# Patient Record
Sex: Female | Born: 1955 | Race: White | Hispanic: No | Marital: Single | Smoking: Former smoker
Health system: Southern US, Community
[De-identification: ages and names within clinical notes are randomized; demographics above are authoritative.]

## PROBLEM LIST (undated history)

## (undated) ENCOUNTER — Ambulatory Visit (HOSPITAL_COMMUNITY): Admission: EM | Payer: BC Managed Care – PPO | Source: Home / Self Care

## (undated) DIAGNOSIS — R011 Cardiac murmur, unspecified: Secondary | ICD-10-CM

## (undated) DIAGNOSIS — R42 Dizziness and giddiness: Secondary | ICD-10-CM

## (undated) DIAGNOSIS — E039 Hypothyroidism, unspecified: Secondary | ICD-10-CM

## (undated) HISTORY — PX: BLEPHAROPLASTY: SUR158

## (undated) HISTORY — PX: RHINOPLASTY: SUR1284

## (undated) HISTORY — PX: CERVICAL DISCECTOMY: SHX98

## (undated) HISTORY — PX: DIAGNOSTIC LAPAROSCOPY: SUR761

---

## 2005-11-19 ENCOUNTER — Ambulatory Visit: Payer: Self-pay | Admitting: Otolaryngology

## 2005-12-09 ENCOUNTER — Encounter: Payer: Self-pay | Admitting: Otolaryngology

## 2005-12-23 ENCOUNTER — Encounter: Payer: Self-pay | Admitting: Otolaryngology

## 2006-01-23 ENCOUNTER — Encounter: Payer: Self-pay | Admitting: Otolaryngology

## 2007-01-30 ENCOUNTER — Emergency Department: Payer: Self-pay | Admitting: Emergency Medicine

## 2007-01-30 ENCOUNTER — Other Ambulatory Visit: Payer: Self-pay

## 2007-08-11 ENCOUNTER — Ambulatory Visit: Payer: Self-pay | Admitting: Emergency Medicine

## 2013-01-29 ENCOUNTER — Emergency Department: Payer: Self-pay | Admitting: Emergency Medicine

## 2013-02-01 ENCOUNTER — Encounter: Payer: Self-pay | Admitting: Emergency Medicine

## 2013-02-20 ENCOUNTER — Encounter: Payer: Self-pay | Admitting: Emergency Medicine

## 2017-07-11 ENCOUNTER — Telehealth: Payer: Self-pay | Admitting: Gastroenterology

## 2017-07-11 NOTE — Telephone Encounter (Signed)
Patient ready to schedule screening colonoscopy, in [place] with Dr. Allen Norris. 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

## 2017-07-14 ENCOUNTER — Other Ambulatory Visit: Payer: Self-pay

## 2017-07-14 DIAGNOSIS — Z1211 Encounter for screening for malignant neoplasm of colon: Secondary | ICD-10-CM

## 2017-07-14 DIAGNOSIS — Z8371 Family history of colonic polyps: Secondary | ICD-10-CM

## 2017-07-14 NOTE — Telephone Encounter (Signed)
Gastroenterology Pre-Procedure Review  Request Date: 08/04/17 Requesting Physician: Dr. Allen Norris  PATIENT REVIEW QUESTIONS: The patient responded to the following health history questions as indicated:    1. Are you having any GI issues? no 2. Do you have a personal history of Polyps? no 3. Do you have a family history of Colon Cancer or Polyps? yes (Dad Polyps) 4. Diabetes Mellitus? no 5. Joint replacements in the past 12 months?no 6. Major health problems in the past 3 months?no 7. Any artificial heart valves, MVP, or defibrillator?no    MEDICATIONS & ALLERGIES:    Patient reports the following regarding taking any anticoagulation/antiplatelet therapy:   Plavix, Coumadin, Eliquis, Xarelto, Lovenox, Pradaxa, Brilinta, or Effient? no Aspirin? no  Patient confirms/reports the following medications:  No current outpatient prescriptions on file.   No current facility-administered medications for this visit.     Patient confirms/reports the following allergies:  Allergies not on file  No orders of the defined types were placed in this encounter.   AUTHORIZATION INFORMATION Primary Insurance: 1D#: Group #:  Secondary Insurance: 1D#: Group #:  SCHEDULE INFORMATION: Date: 08/04/17 Time: Location:[facility]

## 2017-07-22 ENCOUNTER — Telehealth: Payer: Self-pay | Admitting: Gastroenterology

## 2017-07-22 NOTE — Telephone Encounter (Signed)
07/22/17 [employer] state for Screening Colonoscopy 01314 / Z12.11 NO prior auth required Allen Norris, [place], 08/04/17)

## 2017-08-01 NOTE — Discharge Instructions (Signed)
General Anesthesia, Adult, Care After °These instructions provide you with information about caring for yourself after your procedure. Your health care provider may also give you more specific instructions. Your treatment has been planned according to current medical practices, but problems sometimes occur. Call your health care provider if you have any problems or questions after your procedure. °What can I expect after the procedure? °After the procedure, it is common to have: °· Vomiting. °· A sore throat. °· Mental slowness. ° °It is common to feel: °· Nauseous. °· Cold or shivery. °· Sleepy. °· Tired. °· Sore or achy, even in parts of your body where you did not have surgery. ° °Follow these instructions at home: °For at least 24 hours after the procedure: °· Do not: °? Participate in activities where you could fall or become injured. °? Drive. °? Use heavy machinery. °? Drink alcohol. °? Take sleeping pills or medicines that cause drowsiness. °? Make important decisions or sign legal documents. °? Take care of children on your own. °· Rest. °Eating and drinking °· If you vomit, drink water, juice, or soup when you can drink without vomiting. °· Drink enough fluid to keep your urine clear or pale yellow. °· Make sure you have little or no nausea before eating solid foods. °· Follow the diet recommended by your health care provider. °General instructions °· Have a responsible adult stay with you until you are awake and alert. °· Return to your normal activities as told by your health care provider. Ask your health care provider what activities are safe for you. °· Take over-the-counter and prescription medicines only as told by your health care provider. °· If you smoke, do not smoke without supervision. °· Keep all follow-up visits as told by your health care provider. This is important. °Contact a health care provider if: °· You continue to have nausea or vomiting at home, and medicines are not helpful. °· You  cannot drink fluids or start eating again. °· You cannot urinate after 8-12 hours. °· You develop a skin rash. °· You have fever. °· You have increasing redness at the site of your procedure. °Get help right away if: °· You have difficulty breathing. °· You have chest pain. °· You have unexpected bleeding. °· You feel that you are having a life-threatening or urgent problem. °This information is not intended to replace advice given to you by your health care provider. Make sure you discuss any questions you have with your health care provider. °Document Released: 03/17/2001 Document Revised: 05/13/2016 Document Reviewed: 11/23/2015 °Elsevier Interactive Patient Education © 2018 [employer] Inc. ° °

## 2017-08-04 ENCOUNTER — Ambulatory Visit: Payer: BC Managed Care – PPO | Admitting: Anesthesiology

## 2017-08-04 ENCOUNTER — Ambulatory Visit
Admission: RE | Admit: 2017-08-04 | Discharge: 2017-08-04 | Disposition: A | Discharge status: Home / Self Care | Payer: BC Managed Care – PPO | Source: Ambulatory Visit | Attending: Gastroenterology | Admitting: Gastroenterology

## 2017-08-04 ENCOUNTER — Encounter
Admission: RE | Disposition: A | Discharge status: Home / Self Care | Payer: Self-pay | Source: Ambulatory Visit | Attending: Gastroenterology

## 2017-08-04 DIAGNOSIS — R011 Cardiac murmur, unspecified: Secondary | ICD-10-CM | POA: Insufficient documentation

## 2017-08-04 DIAGNOSIS — Z79899 Other long term (current) drug therapy: Secondary | ICD-10-CM | POA: Insufficient documentation

## 2017-08-04 DIAGNOSIS — D122 Benign neoplasm of ascending colon: Secondary | ICD-10-CM | POA: Diagnosis not present

## 2017-08-04 DIAGNOSIS — E039 Hypothyroidism, unspecified: Secondary | ICD-10-CM | POA: Insufficient documentation

## 2017-08-04 DIAGNOSIS — F17210 Nicotine dependence, cigarettes, uncomplicated: Secondary | ICD-10-CM | POA: Insufficient documentation

## 2017-08-04 DIAGNOSIS — Z1211 Encounter for screening for malignant neoplasm of colon: Secondary | ICD-10-CM | POA: Diagnosis not present

## 2017-08-04 HISTORY — DX: Cardiac murmur, unspecified: R01.1

## 2017-08-04 HISTORY — DX: Dizziness and giddiness: R42

## 2017-08-04 HISTORY — PX: POLYPECTOMY: SHX5525

## 2017-08-04 HISTORY — DX: Hypothyroidism, unspecified: E03.9

## 2017-08-04 HISTORY — PX: COLONOSCOPY WITH PROPOFOL: SHX5780

## 2017-08-04 SURGERY — COLONOSCOPY WITH PROPOFOL
Anesthesia: General | Site: Rectum | Wound class: Dirty or Infected

## 2017-08-04 MED ORDER — STERILE WATER FOR IRRIGATION IR SOLN
Status: DC | PRN
  Administered 2017-08-04: 10:00:00

## 2017-08-04 MED ORDER — LACTATED RINGERS IV SOLN
INTRAVENOUS | Status: DC
  Administered 2017-08-04: 10:00:00 via INTRAVENOUS

## 2017-08-04 MED ORDER — PROPOFOL 10 MG/ML IV BOLUS
INTRAVENOUS | Status: DC | PRN
  Administered 2017-08-04: 10 mg via INTRAVENOUS
  Administered 2017-08-04 (×3): 20 mg via INTRAVENOUS
  Administered 2017-08-04: 10 mg via INTRAVENOUS
  Administered 2017-08-04: 80 mg via INTRAVENOUS
  Administered 2017-08-04: 20 mg via INTRAVENOUS

## 2017-08-04 MED ORDER — ONDANSETRON HCL 4 MG/2ML IJ SOLN: 4.0000 mg | Freq: Once | INTRAMUSCULAR | Status: DC | PRN

## 2017-08-04 MED ORDER — LIDOCAINE HCL (CARDIAC) 20 MG/ML IV SOLN
INTRAVENOUS | Status: DC | PRN
  Administered 2017-08-04: 50 mg via INTRAVENOUS

## 2017-08-04 SURGICAL SUPPLY — 23 items
CANISTER SUCT 1200ML W/VALVE (MISCELLANEOUS) ×3 IMPLANT
CLIP HMST 235XBRD CATH ROT (MISCELLANEOUS) IMPLANT
CLIP RESOLUTION 360 11X235 (MISCELLANEOUS)
FCP ESCP3.2XJMB 240X2.8X (MISCELLANEOUS)
FORCEPS BIOP RAD 4 LRG CAP 4 (CUTTING FORCEPS) IMPLANT
FORCEPS BIOP RJ4 240 W/NDL (MISCELLANEOUS)
FORCEPS ESCP3.2XJMB 240X2.8X (MISCELLANEOUS) IMPLANT
GOWN CVR UNV OPN BCK APRN NK (MISCELLANEOUS) ×2 IMPLANT
GOWN ISOL THUMB LOOP REG UNIV (MISCELLANEOUS) ×6
INJECTOR VARIJECT VIN23 (MISCELLANEOUS) IMPLANT
KIT DEFENDO VALVE AND CONN (KITS) IMPLANT
KIT ENDO PROCEDURE OLY (KITS) ×3 IMPLANT
MARKER SPOT ENDO TATTOO 5ML (MISCELLANEOUS) IMPLANT
PAD GROUND ADULT SPLIT (MISCELLANEOUS) IMPLANT
PROBE APC STR FIRE (PROBE) IMPLANT
RETRIEVER NET ROTH 2.5X230 LF (MISCELLANEOUS) IMPLANT
SNARE SHORT THROW 13M SML OVAL (MISCELLANEOUS) ×2 IMPLANT
SNARE SHORT THROW 30M LRG OVAL (MISCELLANEOUS) IMPLANT
SNARE SNG USE RND 15MM (INSTRUMENTS) IMPLANT
SPOT EX ENDOSCOPIC TATTOO (MISCELLANEOUS)
TRAP ETRAP POLY (MISCELLANEOUS) ×2 IMPLANT
VARIJECT INJECTOR VIN23 (MISCELLANEOUS)
WATER STERILE IRR 250ML POUR (IV SOLUTION) ×3 IMPLANT

## 2017-08-04 NOTE — Transfer of Care (Signed)
Immediate Anesthesia Transfer of Care Note  Patient: [name]  Procedure(s) Performed: Procedure(s): COLONOSCOPY WITH PROPOFOL (N/A) POLYPECTOMY (N/A)  Patient Location: PACU  Anesthesia Type: General  Level of Consciousness: awake, alert  and patient cooperative  Airway and Oxygen Therapy: Patient Spontanous Breathing and Patient connected to supplemental oxygen  Post-op Assessment: Post-op Vital signs reviewed, Patient's Cardiovascular Status Stable, Respiratory Function Stable, Patent Airway and No signs of Nausea or vomiting  Post-op Vital Signs: Reviewed and stable  Complications: No apparent anesthesia complications

## 2017-08-04 NOTE — Op Note (Signed)
[facility] Gastroenterology Patient Name: Julie Sparks Procedure Date: 08/04/2017 10:14 AM MRN: [id number] Account #: [id number] Date of Birth: Apr 26, 1956 Admit Type: Outpatient Age: 61 Room: [facility] OR ROOM 01 Gender: Female Note Status: Finalized Procedure:            Colonoscopy Indications:          Screening for colorectal malignant neoplasm Providers:            Lucilla Lame MD, MD Referring MD:         Marney Doctor, MD (Referring MD) Medicines:            Propofol per Anesthesia Complications:        No immediate complications. Procedure:            Pre-Anesthesia Assessment:                       - Prior to the procedure, a History and Physical was                        performed, and patient medications and allergies were                        reviewed. The patient's tolerance of previous                        anesthesia was also reviewed. The risks and benefits of                        the procedure and the sedation options and risks were                        discussed with the patient. All questions were                        answered, and informed consent was obtained. Prior                        Anticoagulants: The patient has taken no previous                        anticoagulant or antiplatelet agents. ASA Grade                        Assessment: II - A patient with mild systemic disease.                        After reviewing the risks and benefits, the patient was                        deemed in satisfactory condition to undergo the                        procedure.                       After obtaining informed consent, the colonoscope was                        passed under direct vision. Throughout the procedure,  the patient's blood pressure, pulse, and oxygen                        saturations were monitored continuously. The Olympus                        Colonoscope 190 [id number]) was introduced through the                         anus and advanced to the the cecum, identified by                        appendiceal orifice and ileocecal valve. The                        colonoscopy was performed without difficulty. The                        patient tolerated the procedure well. The quality of                        the bowel preparation was excellent. Findings:      The perianal and digital rectal examinations were normal.      A 5 mm polyp was found in the ascending colon. The polyp was sessile.       The polyp was removed with a cold snare. Resection and retrieval were       complete. Impression:           - One 5 mm polyp in the ascending colon, removed with a                        cold snare. Resected and retrieved. Recommendation:       - Discharge patient to home.                       - Resume previous diet.                       - Continue present medications.                       - Repeat colonoscopy in 5 years if polyp adenoma and 10                        years if hyperplastic Procedure Code(s):    --- Professional ---                       [phone], Colonoscopy, flexible; with removal of tumor(s),                        polyp(s), or other lesion(s) by snare technique Diagnosis Code(s):    --- Professional ---                       Z12.11, Encounter for screening for malignant neoplasm                        of colon                       D12.2,  Benign neoplasm of ascending colon CPT copyright 2016 American Medical Association. All rights reserved. The codes documented in this report are preliminary and upon coder review may  be revised to meet current compliance requirements. Lucilla Lame MD, MD 08/04/2017 10:34:30 AM This report has been signed electronically. Number of Addenda: 0 Note Initiated On: 08/04/2017 10:14 AM Scope Withdrawal Time: 0 hours 6 minutes 0 seconds  Total Procedure Duration: 0 hours 10 minutes 52 seconds       [facility]

## 2017-08-04 NOTE — Anesthesia Procedure Notes (Signed)
Performed by: Luiz Trumpower Pre-anesthesia Checklist: Patient identified, Emergency Drugs available, Suction available, Timeout performed and Patient being monitored Patient Re-evaluated:Patient Re-evaluated prior to induction Oxygen Delivery Method: Nasal cannula Placement Confirmation: positive ETCO2       

## 2017-08-04 NOTE — Anesthesia Postprocedure Evaluation (Signed)
Anesthesia Post Note  Patient: Julie Sparks  Procedure(s) Performed: Procedure(s) (LRB): COLONOSCOPY WITH PROPOFOL (N/A) POLYPECTOMY (N/A)  Patient location during evaluation: PACU Anesthesia Type: General Level of consciousness: awake and alert, oriented and patient cooperative Pain management: pain level controlled Vital Signs Assessment: post-procedure vital signs reviewed and stable Respiratory status: spontaneous breathing, nonlabored ventilation and respiratory function stable Cardiovascular status: blood pressure returned to baseline and stable Postop Assessment: adequate PO intake Anesthetic complications: no    Darrin Nipper

## 2017-08-04 NOTE — H&P (Signed)
   Lucilla Lame, MD [address], [place] [address] Phone: [phone] Fax : [phone]  Primary Care Physician:  Rhoderick Moody, MD Primary Gastroenterologist:  Dr. Allen Norris  Pre-Procedure History & Physical: HPI:  Julie Sparks is a 61 y.o. female is here for a screening colonoscopy.   Past Medical History:  Diagnosis Date  . Heart murmur    Functional  . Hypothyroidism   . Vertigo    positional    Past Surgical History:  Procedure Laterality Date  . BLEPHAROPLASTY Bilateral   . CERVICAL DISCECTOMY    . DIAGNOSTIC LAPAROSCOPY    . RHINOPLASTY      Prior to Admission medications   Medication Sig Start Date End Date Taking? Authorizing Provider  Estradiol-Norethindrone Acet 0.5-0.1 MG tablet Take 1 tablet by mouth daily.   Yes [provider]  ibuprofen (ADVIL,MOTRIN) 200 MG tablet Take 200 mg by mouth every 6 (six) hours as needed.   Yes [provider]  levothyroxine (SYNTHROID, LEVOTHROID) 25 MCG tablet Take 25 mcg by mouth daily before breakfast.   Yes [provider]    Allergies as of 07/14/2017  . (Not on File)    History reviewed. No pertinent family history.  Social History   Social History  . Marital status: Single    Spouse name: N/A  . Number of children: N/A  . Years of education: N/A   Occupational History  . Not on file.   Social History Main Topics  . Smoking status: Light Tobacco Smoker  . Smokeless tobacco: Never Used     Comment: only smokes when stressed  . Alcohol use Not on file     Comment: occasional  . Drug use: No  . Sexual activity: Not on file   Other Topics Concern  . Not on file   Social History Narrative  . No narrative on file    Review of Systems: See HPI, otherwise negative ROS  Physical Exam: BP 112/66   Pulse 66   Temp 98.2 F (36.8 C) (Tympanic)   Resp 16   Ht 5' 1.5" (1.562 m)   Wt 155 lb (70.3 kg)   SpO2 98%   BMI 28.81 kg/m  General:   Alert,   pleasant and cooperative in NAD Head:  Normocephalic and atraumatic. Neck:  Supple; no masses or thyromegaly. Lungs:  Clear throughout to auscultation.    Heart:  Regular rate and rhythm. Abdomen:  Soft, nontender and nondistended. Normal bowel sounds, without guarding, and without rebound.   Neurologic:  Alert and  oriented x4;  grossly normal neurologically.  Impression/Plan: Julie Sparks is now here to undergo a screening colonoscopy.  Risks, benefits, and alternatives regarding colonoscopy have been reviewed with the patient.  Questions have been answered.  All parties agreeable.

## 2017-08-04 NOTE — Anesthesia Preprocedure Evaluation (Signed)
Anesthesia Evaluation  Patient identified by MRN, date of birth, ID band Patient awake    Reviewed: Allergy & Precautions, NPO status , Patient's Chart, lab work & pertinent test results  History of Anesthesia Complications Negative for: history of anesthetic complications  Airway Mallampati: I  TM Distance: >3 FB Neck ROM: Full    Dental no notable dental hx.    Pulmonary Current Smoker (2 cigarettes per day),  Snoring    Pulmonary exam normal breath sounds clear to auscultation       Cardiovascular Exercise Tolerance: Good + Valvular Problems/Murmurs (heart murmur)  Rhythm:Regular Rate:Normal     Neuro/Psych Vertigo     GI/Hepatic negative GI ROS,   Endo/Other  Hypothyroidism   Renal/GU negative Renal ROS     Musculoskeletal   Abdominal   Peds  Hematology negative hematology ROS (+)   Anesthesia Other Findings   Reproductive/Obstetrics                             Anesthesia Physical Anesthesia Plan  ASA: II  Anesthesia Plan: General   Post-op Pain Management:    Induction: Intravenous  PONV Risk Score and Plan: 1 and Ondansetron and Propofol infusion  Airway Management Planned: Natural Airway  Additional Equipment:   Intra-op Plan:   Post-operative Plan:   Informed Consent: I have reviewed the patients History and Physical, chart, labs and discussed the procedure including the risks, benefits and alternatives for the proposed anesthesia with the patient or authorized representative who has indicated his/her understanding and acceptance.     Plan Discussed with: CRNA  Anesthesia Plan Comments:         Anesthesia Quick Evaluation

## 2017-08-05 ENCOUNTER — Encounter: Payer: Self-pay | Admitting: Gastroenterology

## 2017-08-06 ENCOUNTER — Encounter: Payer: Self-pay | Admitting: Gastroenterology

## 2017-08-07 ENCOUNTER — Encounter: Payer: Self-pay | Admitting: Gastroenterology

## 2018-04-10 ENCOUNTER — Other Ambulatory Visit: Payer: Self-pay | Admitting: Obstetrics and Gynecology

## 2018-04-10 DIAGNOSIS — Z1231 Encounter for screening mammogram for malignant neoplasm of breast: Secondary | ICD-10-CM

## 2018-04-28 ENCOUNTER — Encounter (INDEPENDENT_AMBULATORY_CARE_PROVIDER_SITE_OTHER): Payer: Self-pay

## 2018-04-28 ENCOUNTER — Ambulatory Visit
Admission: RE | Admit: 2018-04-28 | Discharge: 2018-04-28 | Disposition: A | Discharge status: Home / Self Care | Payer: BC Managed Care – PPO | Source: Ambulatory Visit | Attending: Obstetrics and Gynecology | Admitting: Obstetrics and Gynecology

## 2018-04-28 DIAGNOSIS — Z1231 Encounter for screening mammogram for malignant neoplasm of breast: Secondary | ICD-10-CM | POA: Diagnosis not present

## 2018-05-01 ENCOUNTER — Other Ambulatory Visit: Payer: Self-pay | Admitting: *Deleted

## 2018-05-01 ENCOUNTER — Inpatient Hospital Stay
Admission: RE | Admit: 2018-05-01 | Discharge: 2018-05-01 | Disposition: A | Discharge status: Home / Self Care | Payer: Self-pay | Source: Ambulatory Visit | Attending: *Deleted | Admitting: *Deleted

## 2018-05-01 DIAGNOSIS — Z9289 Personal history of other medical treatment: Secondary | ICD-10-CM

## 2019-04-26 IMAGING — MG MM DIGITAL SCREENING BILAT W/ TOMO W/ CAD
8 series · 8 of 24 positions shown · non-contrast
Comparison: Previous exam(s).

CLINICAL DATA: Screening.

EXAM:
DIGITAL SCREENING BILATERAL MAMMOGRAM WITH TOMO AND CAD

[R CC synth-2D]
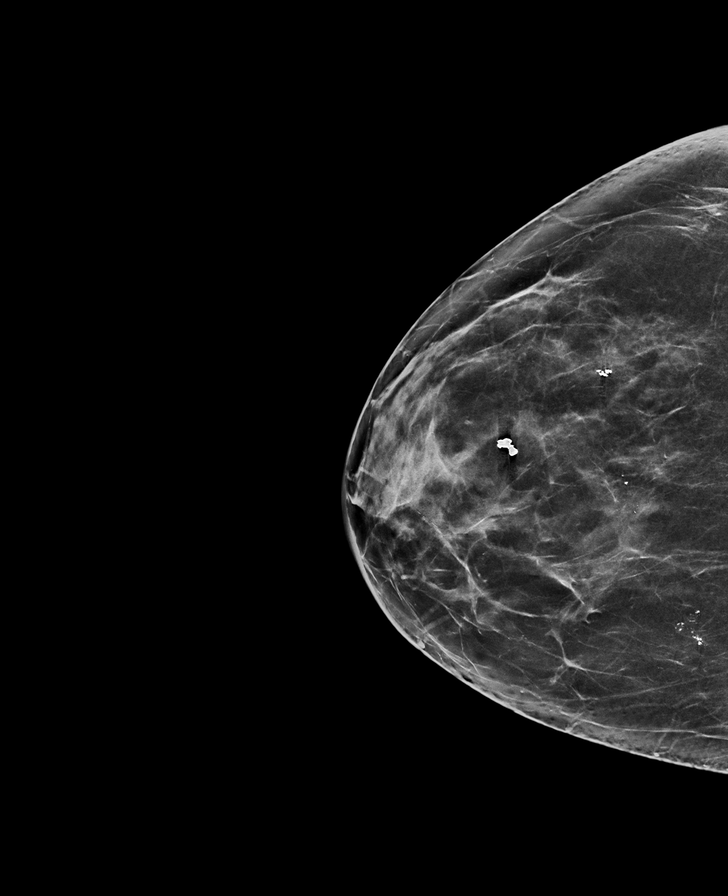

[L MLO synth-2D]
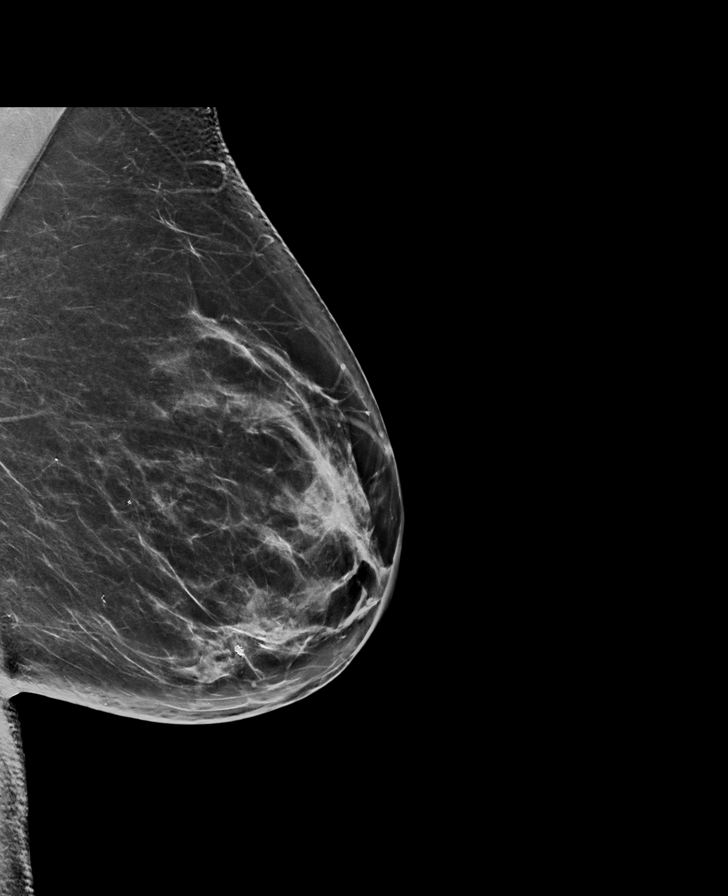

[R MLO synth-2D]
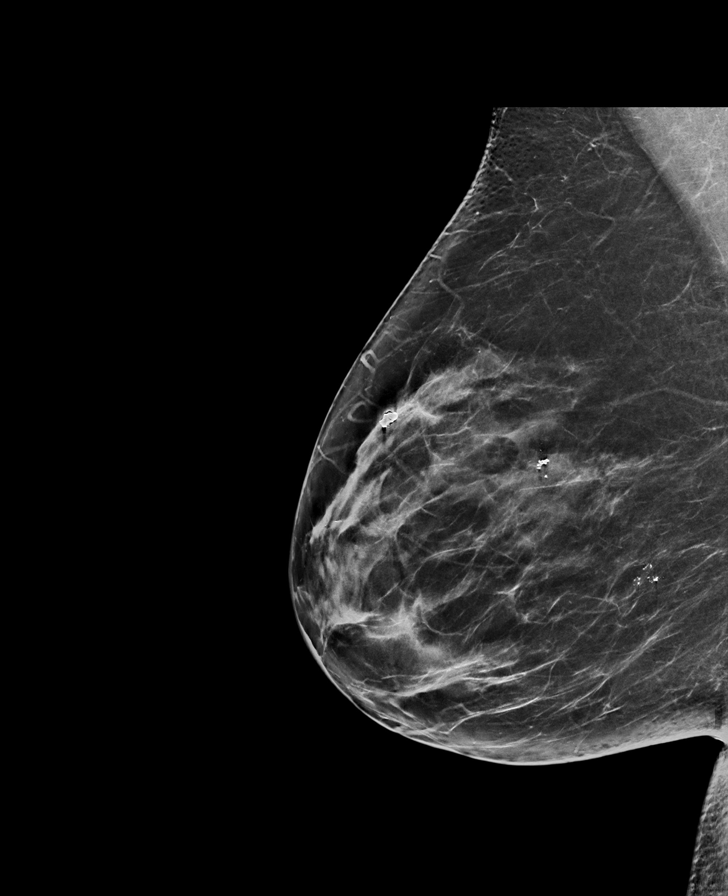

[L CC synth-2D]
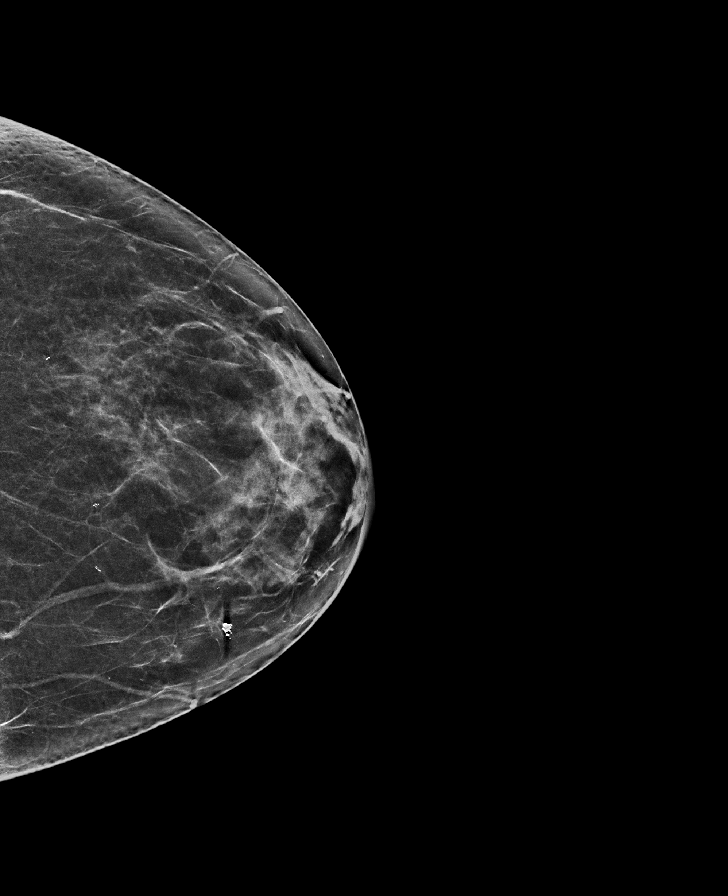

[R CC tomo · tomo slice 37/72.0]
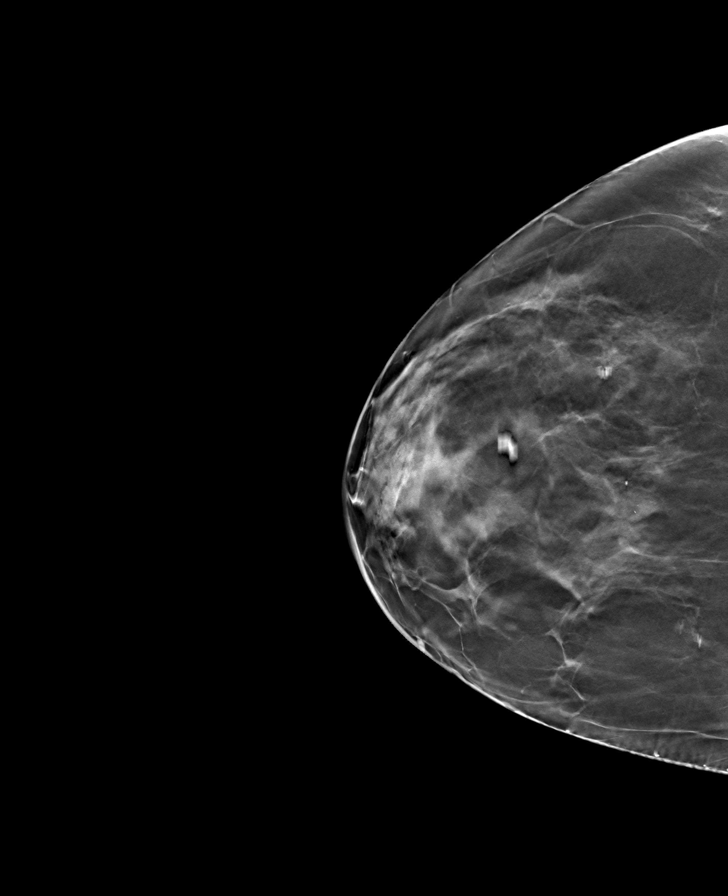

[L MLO tomo · tomo slice 39/78.0]
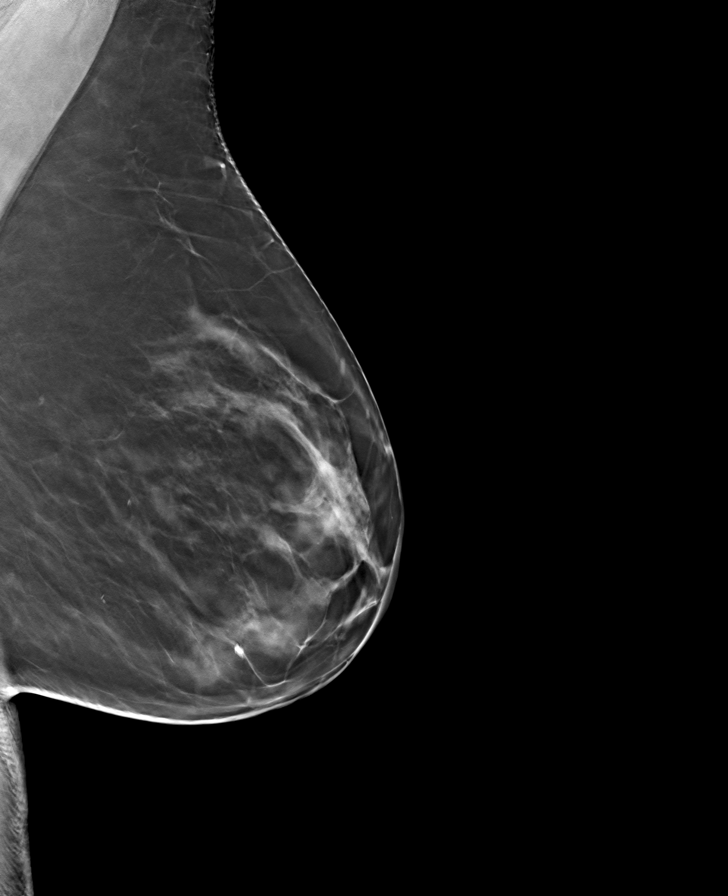

[R MLO tomo · tomo slice 39/77.0]
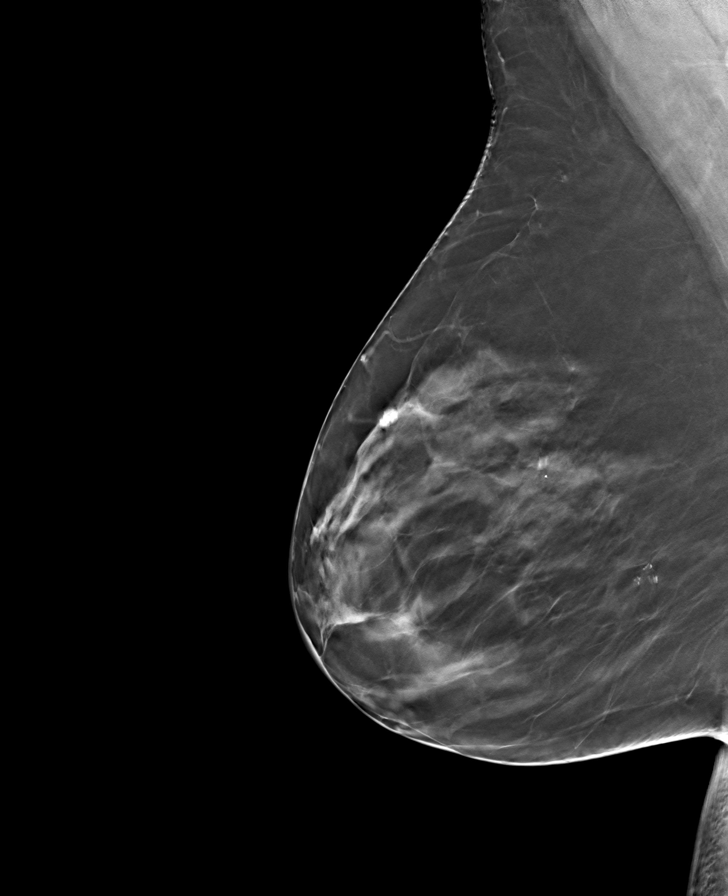

[L CC tomo · tomo slice 35/69.0]
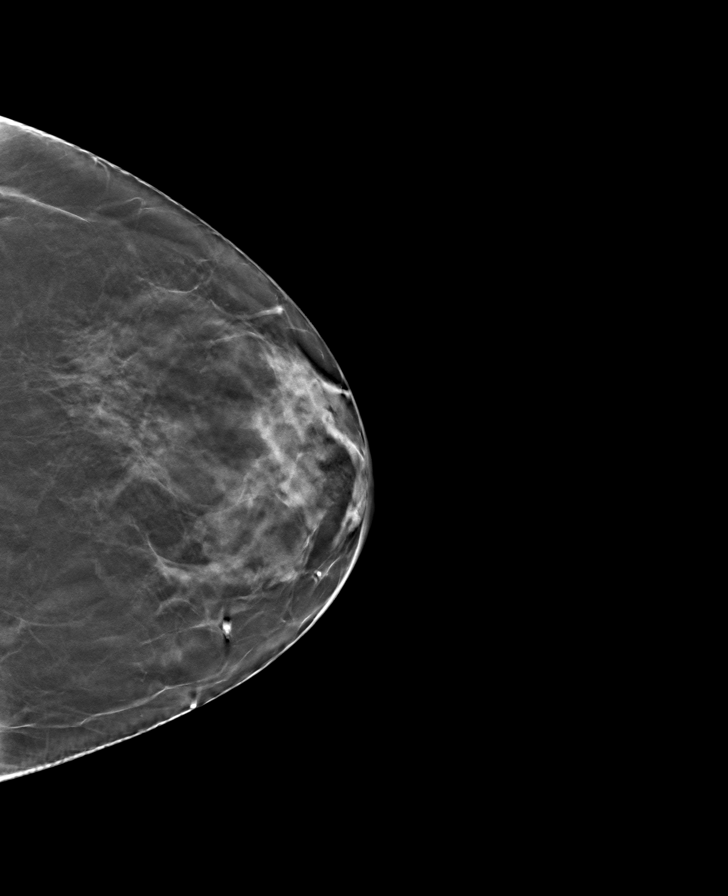

[8 of 24 positions shown; findings below may reference images not displayed]

ACR Breast Density Category b: There are scattered areas of
fibroglandular density.
FINDINGS: There are no findings suspicious for malignancy. Images were
processed with CAD.
IMPRESSION: No mammographic evidence of malignancy. A result letter of this
screening mammogram will be mailed directly to the patient.

RECOMMENDATION:
Screening mammogram in one year. (Code:[id number]-775)

BI-RADS CATEGORY  1: Negative.

## 2019-06-29 ENCOUNTER — Other Ambulatory Visit: Payer: Self-pay

## 2019-06-29 ENCOUNTER — Other Ambulatory Visit: Payer: Self-pay | Admitting: Obstetrics and Gynecology

## 2019-06-29 DIAGNOSIS — Z1231 Encounter for screening mammogram for malignant neoplasm of breast: Secondary | ICD-10-CM

## 2019-07-14 ENCOUNTER — Other Ambulatory Visit: Payer: Self-pay

## 2019-07-14 ENCOUNTER — Encounter (INDEPENDENT_AMBULATORY_CARE_PROVIDER_SITE_OTHER): Payer: Self-pay

## 2019-07-14 ENCOUNTER — Ambulatory Visit
Admission: RE | Admit: 2019-07-14 | Discharge: 2019-07-14 | Disposition: A | Discharge status: Home / Self Care | Payer: BC Managed Care – PPO | Source: Ambulatory Visit | Attending: Obstetrics and Gynecology | Admitting: Obstetrics and Gynecology

## 2019-07-14 DIAGNOSIS — Z1231 Encounter for screening mammogram for malignant neoplasm of breast: Secondary | ICD-10-CM | POA: Insufficient documentation

## 2019-10-08 ENCOUNTER — Encounter: Payer: Self-pay | Admitting: Emergency Medicine

## 2019-10-08 ENCOUNTER — Ambulatory Visit
Admission: EM | Admit: 2019-10-08 | Discharge: 2019-10-08 | Disposition: A | Discharge status: Home / Self Care | Payer: BC Managed Care – PPO | Attending: Emergency Medicine | Admitting: Emergency Medicine

## 2019-10-08 ENCOUNTER — Other Ambulatory Visit: Payer: Self-pay

## 2019-10-08 DIAGNOSIS — H6122 Impacted cerumen, left ear: Secondary | ICD-10-CM

## 2019-10-08 DIAGNOSIS — H938X2 Other specified disorders of left ear: Secondary | ICD-10-CM

## 2019-10-08 NOTE — Discharge Instructions (Addendum)
I would suggest wearing earmuffs rather than earplugs when you do your yard work.  You can use Debrox to help prevent wax buildup in the future.  Try using Flonase on a daily basis.  Try an antihistamine such as Claritin, Allegra, or Zyrtec to see if this helps.  If this is not help, then follow-up with ENT.  There is no evidence of infection or perforation today.

## 2019-10-08 NOTE — ED Triage Notes (Signed)
Patient c/o fullness and pain in her left ear that started 2 days ago.

## 2019-10-08 NOTE — ED Provider Notes (Signed)
HPI  SUBJECTIVE:  Julie Sparks is a 63 y.o. female who presents with left ear fullness for the past several weeks.  She states that it feels "uncomfortable", but denies pain.  Reports decreased hearing during this time, reports complete loss of hearing since last night after putting peroxide in her ear.  She denies fevers, nasal congestion, URI, allergy symptoms.  No otorrhea.  No vertigo.  She reports tinnitus, but states that this is always present.  It has not changed.  She wears earplugs when she does yard work which is more than once a week.  She tried putting peroxide in her ear, and states that "it would not come out".  She tried suction, jumping, hairdryer, tugging on her ear, yawning.  No aggravating or alleviating factors.  Past medical history of bilateral vertigo.  PMD: Dr. Jaquita Rector  ENT: None.    Past Medical History:  Diagnosis Date  . Heart murmur    Functional  . Hypothyroidism   . Vertigo    positional    Past Surgical History:  Procedure Laterality Date  . BLEPHAROPLASTY Bilateral   . CERVICAL DISCECTOMY    . COLONOSCOPY WITH PROPOFOL N/A 08/04/2017   Procedure: COLONOSCOPY WITH PROPOFOL;  Surgeon: Lucilla Lame, MD;  Location: [place];  Service: Gastroenterology;  Laterality: N/A;  . DIAGNOSTIC LAPAROSCOPY    . POLYPECTOMY N/A 08/04/2017   Procedure: POLYPECTOMY;  Surgeon: Lucilla Lame, MD;  Location: [place];  Service: Gastroenterology;  Laterality: N/A;  . RHINOPLASTY      Family History  Problem Relation Age of Onset  . Breast cancer Neg Hx     Social History   Tobacco Use  . Smoking status: Former Research scientist (life sciences)  . Smokeless tobacco: Never Used  . Tobacco comment: only smokes when stressed  Substance Use Topics  . Alcohol use: Yes    Comment: occasional  . Drug use: No    No current facility-administered medications for this encounter.   Current Outpatient Medications:  .  levothyroxine (SYNTHROID, LEVOTHROID) 25 MCG tablet,  Take 25 mcg by mouth daily before breakfast., Disp: , Rfl:  .  Estradiol-Norethindrone Acet 0.5-0.1 MG tablet, Take 1 tablet by mouth daily., Disp: , Rfl:  .  ibuprofen (ADVIL,MOTRIN) 200 MG tablet, Take 200 mg by mouth every 6 (six) hours as needed., Disp: , Rfl:   Allergies  Allergen Reactions  . Cefuroxime Axetil Hives  . Amoxicillin   . Amoxicillin-Pot Clavulanate Nausea Only  . Ceftin  [Cefuroxime]      ROS  As noted in HPI.   Physical Exam  BP (!) 155/73 (BP Location: Left Arm)   Pulse 67   Temp 98.3 F (36.8 C) (Oral)   Resp 16   Ht 5\' 1"  (1.549 m)   Wt 70.8 kg   SpO2 98%   BMI 29.48 kg/m   Constitutional: Well developed, well nourished, no acute distress Eyes:  EOMI, conjunctiva normal bilaterally HENT: Normocephalic, atraumatic,mucus membranes moist.  Left external ear normal, no pain with palpation of mastoid, and traction on pinna, palpation of tragus.  TM mostly obscured with earwax.  Hearing intact but decreased compared to other ear.  Right TM normal. Respiratory: Normal inspiratory effort Cardiovascular: Normal rate GI: nondistended skin: No rash, skin intact Musculoskeletal: no deformities Neurologic: Alert & oriented x 3, no focal neuro deficits Psychiatric: Speech and behavior appropriate   ED Course   Medications - No data to display  Orders Placed This Encounter  Procedures  .  Ear wax removal    L Ear only    Standing Status:   Standing    Number of Occurrences:   1    No results found for this or any previous visit (from the past 24 hour(s)). No results found.  ED Clinical Impression  1. Impacted cerumen of left ear   2. Sensation of fullness in left ear      ED Assessment/Plan  We will have left ear irrigated out, reevaluate.  Reevaluation, patient states that she feels better.  Hearing significantly improved. L TM intact, normal.  No fluid behind the TM, no erythema, bulging, perforation noted.  States that she still has some  fullness.  Will try some Claritin, Allegra, or Zyrtec, Flonase on a daily basis.  Follow-up with Dr. Pryor Ochoa, ENT on-call if this does not help.   Discussed MDM, treatment plan, and plan for follow-up with patient. patient agrees with plan.   No orders of the defined types were placed in this encounter.   *This clinic note was created using Dragon dictation software. Therefore, there may be occasional mistakes despite careful proofreading.   ?   Melynda Ripple, MD 10/08/19 [phone]

## 2020-10-11 ENCOUNTER — Other Ambulatory Visit: Payer: Self-pay | Admitting: Obstetrics and Gynecology

## 2020-10-11 DIAGNOSIS — Z1231 Encounter for screening mammogram for malignant neoplasm of breast: Secondary | ICD-10-CM

## 2020-10-30 ENCOUNTER — Ambulatory Visit: Payer: BC Managed Care – PPO

## 2021-01-10 ENCOUNTER — Ambulatory Visit
Admission: RE | Admit: 2021-01-10 | Discharge: 2021-01-10 | Disposition: A | Discharge status: Home / Self Care | Payer: Medicare Other | Source: Ambulatory Visit | Attending: Obstetrics and Gynecology | Admitting: Obstetrics and Gynecology

## 2021-01-10 ENCOUNTER — Other Ambulatory Visit: Payer: Self-pay

## 2021-01-10 DIAGNOSIS — Z1231 Encounter for screening mammogram for malignant neoplasm of breast: Secondary | ICD-10-CM | POA: Insufficient documentation

## 2021-06-28 ENCOUNTER — Other Ambulatory Visit: Payer: Self-pay

## 2021-06-28 ENCOUNTER — Ambulatory Visit
Admission: EM | Admit: 2021-06-28 | Discharge: 2021-06-28 | Disposition: A | Discharge status: Home / Self Care | Payer: Medicare Other | Attending: Emergency Medicine | Admitting: Emergency Medicine

## 2021-06-28 DIAGNOSIS — T63461A Toxic effect of venom of wasps, accidental (unintentional), initial encounter: Secondary | ICD-10-CM

## 2021-06-28 DIAGNOSIS — L03116 Cellulitis of left lower limb: Secondary | ICD-10-CM

## 2021-06-28 MED ORDER — DOXYCYCLINE HYCLATE 100 MG PO CAPS: 100.0000 mg | ORAL_CAPSULE | Freq: Two times a day (BID) | ORAL | 0 refills | Status: DC

## 2021-06-28 MED ORDER — FLUCONAZOLE 200 MG PO TABS: 200.0000 mg | ORAL_TABLET | Freq: Every day | ORAL | 1 refills | Status: AC

## 2021-06-28 NOTE — Discharge Instructions (Addendum)
Take the Doxycycline twice daily with food for 10 days.  Doxycycline will make you more sensitive to sunburn so wear sunscreen when outdoors and reapply it every 90 minutes.  Take an OTC probiotic 1 hour after each dose of Doxycycline to prevent diarrhea.  Take the Diflucan if you develop symptoms of a yeast infection. You can repeat dosing in 1 week if no improvement of your symptoms.   Apply topical Benadryl cream 4 times a day as needed for itching.   Use OTC Tylenol and Ibuprofen according to the package instructions as needed for pain.  Return for new or worsening symptoms.

## 2021-06-28 NOTE — ED Triage Notes (Signed)
Pt c/o wasp sting to her left anterior thigh. Pt now has an itchy rash around the area. She is concerned about a tick bite and Lyme disease. Pt is certain the initial wound was a wasp sting. Pt denies f/n/v/d or other symptoms.

## 2021-06-28 NOTE — ED Provider Notes (Signed)
[facility]    CSN: [id number] Arrival date & time: 06/28/21  1049      History   Chief Complaint Chief Complaint  Patient presents with   Insect Bite    HPI Julie Sparks is a 65 y.o. female.   HPI  65 year old female here for evaluation of itchy rash and wasp sting.  Patient reports that over 1 week ago she was stung by wasp on the back part of her left hip and then last night she noticed no red itchy and mildly painful rash around the front and top of the area where she was stung.  She denies any fever or drainage from the area.  She went to have it evaluated because she was concerned she might be developing Lyme disease.  She reports that she was definitely stung by wasp and she denies any tick bites.  She does report that she worked in the garden yesterday but does not remember being bitten by anything while outdoors.  Past Medical History:  Diagnosis Date   Heart murmur    Functional   Hypothyroidism    Vertigo    positional    Patient Active Problem List   Diagnosis Date Noted   Special screening for malignant neoplasms, colon    Benign neoplasm of ascending colon     Past Surgical History:  Procedure Laterality Date   BLEPHAROPLASTY Bilateral    CERVICAL DISCECTOMY     COLONOSCOPY WITH PROPOFOL N/A 08/04/2017   Procedure: COLONOSCOPY WITH PROPOFOL;  Surgeon: Lucilla Lame, MD;  Location: [place];  Service: Gastroenterology;  Laterality: N/A;   DIAGNOSTIC LAPAROSCOPY     POLYPECTOMY N/A 08/04/2017   Procedure: POLYPECTOMY;  Surgeon: Lucilla Lame, MD;  Location: [place];  Service: Gastroenterology;  Laterality: N/A;   RHINOPLASTY      OB History   No obstetric history on file.      Home Medications    Prior to Admission medications   Medication Sig Start Date End Date Taking? Authorizing Provider  doxycycline (VIBRAMYCIN) 100 MG capsule Take 1 capsule (100 mg total) by mouth 2 (two) times daily. 06/28/21  Yes Margarette Canada, NP  fluconazole (DIFLUCAN) 200 MG tablet Take 1 tablet (200 mg total) by mouth daily for 2 doses. 06/28/21 06/30/21 Yes Margarette Canada, NP  levothyroxine (SYNTHROID, LEVOTHROID) 25 MCG tablet Take 25 mcg by mouth daily before breakfast.   Yes [provider]  Estradiol-Norethindrone Acet 0.5-0.1 MG tablet Take 1 tablet by mouth daily.    [provider]  ibuprofen (ADVIL,MOTRIN) 200 MG tablet Take 200 mg by mouth every 6 (six) hours as needed.    [provider]    Family History Family History  Problem Relation Age of Onset   Breast cancer Neg Hx     Social History Social History   Tobacco Use   Smoking status: Former    Pack years: 0.00   Smokeless tobacco: Never   Tobacco comments:    only smokes when stressed  Vaping Use   Vaping Use: Never used  Substance Use Topics   Alcohol use: Yes    Comment: occasional   Drug use: No     Allergies   Cefuroxime axetil, Amoxicillin, Amoxicillin-pot clavulanate, and Ceftin  [cefuroxime]   Review of Systems Review of Systems  Constitutional:  Negative for fever.  Musculoskeletal:  Negative for arthralgias.  Skin:  Positive for color change and rash.  Neurological:  Negative for headaches.  Hematological:  Negative for adenopathy.    Physical Exam Triage Vital Signs ED Triage Vitals  Enc Vitals Group     BP 06/28/21 1206 (!) 118/58     Pulse Rate 06/28/21 1206 60     Resp 06/28/21 1206 18     Temp 06/28/21 1206 98.5 F (36.9 C)     Temp Source 06/28/21 1206 Oral     SpO2 06/28/21 1206 99 %     Weight 06/28/21 1204 150 lb (68 kg)     Height 06/28/21 1204 5' (1.524 m)     Head Circumference --      Peak Flow --      Pain Score 06/28/21 1203 2     Pain Loc --      Pain Edu? --      Excl. in [place]? --    No data found.  Updated Vital Signs BP (!) 118/58 (BP Location: Left Arm)   Pulse 60   Temp 98.5 F (36.9 C) (Oral)   Resp 18   Ht 5' (1.524 m)   Wt 150 lb (68 kg)   SpO2 99%   BMI  29.29 kg/m   Visual Acuity Right Eye Distance:   Left Eye Distance:   Bilateral Distance:    Right Eye Near:   Left Eye Near:    Bilateral Near:     Physical Exam Vitals and nursing note reviewed.  Constitutional:      General: She is not in acute distress.    Appearance: Normal appearance. She is normal weight. She is not ill-appearing.  Skin:    General: Skin is warm and dry.     Capillary Refill: Capillary refill takes less than 2 seconds.     Findings: Erythema and rash present.  Neurological:     General: No focal deficit present.     Mental Status: She is alert and oriented to person, place, and time.  Psychiatric:        Mood and Affect: Mood normal.        Behavior: Behavior normal.        Thought Content: Thought content normal.        Judgment: Judgment normal.     UC Treatments / Results  Labs (all labs ordered are listed, but only abnormal results are displayed) Labs Reviewed - No data to display  EKG   Radiology No results found.  Procedures Procedures (including critical care time)  Medications Ordered in UC Medications - No data to display  Initial Impression / Assessment and Plan / UC Course  I have reviewed the triage vital signs and the nursing notes.  Pertinent labs & imaging results that were available during my care of the patient were reviewed by me and considered in my medical decision making (see chart for details).  Very pleasant and nontoxic-appearing 65 year old female here for evaluation of an itchy red rash surrounding a wasp sting.  She was stung 1 week ago and noticed an itchy red rash last night.  Patient's physical exam reveals the presence of a wasp sting with surrounding area of fading erythema.  There is a crescent pattern anterior and superior to the existing rash that is erythematous, flat, and hot.  There is no fluctuance or induration.  There is no erythema migrans rash noted.  No signs of tick bite.  Suspect patient is  developing cellulitis possibly secondary to the wasp sting.  We will treat patient with doxycycline and she is allergic to cephalosporins and  penicillins twice daily for 10 days.  Patient vies to apply topical Benadryl cream as needed for itching.  Return precautions reviewed with patient.   Final Clinical Impressions(s) / UC Diagnoses   Final diagnoses:  Cellulitis of left lower extremity  Wasp sting, accidental or unintentional, initial encounter     Discharge Instructions      Take the Doxycycline twice daily with food for 10 days.  Doxycycline will make you more sensitive to sunburn so wear sunscreen when outdoors and reapply it every 90 minutes.  Take an OTC probiotic 1 hour after each dose of Doxycycline to prevent diarrhea.  Take the Diflucan if you develop symptoms of a yeast infection. You can repeat dosing in 1 week if no improvement of your symptoms.   Apply topical Benadryl cream 4 times a day as needed for itching.   Use OTC Tylenol and Ibuprofen according to the package instructions as needed for pain.  Return for new or worsening symptoms.       ED Prescriptions     Medication Sig Dispense Auth. Provider   doxycycline (VIBRAMYCIN) 100 MG capsule Take 1 capsule (100 mg total) by mouth 2 (two) times daily. 20 capsule Margarette Canada, NP   fluconazole (DIFLUCAN) 200 MG tablet Take 1 tablet (200 mg total) by mouth daily for 2 doses. 2 tablet Margarette Canada, NP      PDMP not reviewed this encounter.   Margarette Canada, NP 06/28/21 1301

## 2021-10-01 ENCOUNTER — Encounter: Payer: Self-pay | Admitting: Emergency Medicine

## 2021-10-01 ENCOUNTER — Other Ambulatory Visit: Payer: Self-pay

## 2021-10-01 ENCOUNTER — Ambulatory Visit
Admission: EM | Admit: 2021-10-01 | Discharge: 2021-10-01 | Disposition: A | Discharge status: Home / Self Care | Payer: Medicare Other | Attending: Internal Medicine | Admitting: Internal Medicine

## 2021-10-01 DIAGNOSIS — L0291 Cutaneous abscess, unspecified: Secondary | ICD-10-CM | POA: Insufficient documentation

## 2021-10-01 DIAGNOSIS — L0231 Cutaneous abscess of buttock: Secondary | ICD-10-CM | POA: Diagnosis not present

## 2021-10-01 MED ORDER — FLUCONAZOLE 150 MG PO TABS: 150.0000 mg | ORAL_TABLET | Freq: Every day | ORAL | 0 refills | Status: DC

## 2021-10-01 MED ORDER — SULFAMETHOXAZOLE-TRIMETHOPRIM 800-160 MG PO TABS: 1.0000 | ORAL_TABLET | Freq: Two times a day (BID) | ORAL | 0 refills | Status: AC

## 2021-10-01 NOTE — ED Triage Notes (Signed)
Pt c/o pain in the rectal area. She states she feels a knot beside her rectum. Started about 4 days ago but has gotten bigger.

## 2021-10-01 NOTE — Discharge Instructions (Addendum)
Come back for pack changes if it falls out in 24 hours and you still have moderate puss draining  Use dry heat on area for 20 minutes 3-4 times a day today and tomorrow

## 2021-10-01 NOTE — ED Provider Notes (Signed)
[facility]    CSN: [id number] Arrival date & time: 10/01/21  8453      History   Chief Complaint Chief Complaint  Patient presents with   Rectal Pain    HPI Julie Sparks is a 65 y.o. female who has had L buttocks abscess x 4 days. She has never had this before. While here noticed some blood while using the bathroom.     Past Medical History:  Diagnosis Date   Heart murmur    Functional   Hypothyroidism    Vertigo    positional    Patient Active Problem List   Diagnosis Date Noted   Special screening for malignant neoplasms, colon    Benign neoplasm of ascending colon     Past Surgical History:  Procedure Laterality Date   BLEPHAROPLASTY Bilateral    CERVICAL DISCECTOMY     COLONOSCOPY WITH PROPOFOL N/A 08/04/2017   Procedure: COLONOSCOPY WITH PROPOFOL;  Surgeon: Lucilla Lame, MD;  Location: [place];  Service: Gastroenterology;  Laterality: N/A;   DIAGNOSTIC LAPAROSCOPY     POLYPECTOMY N/A 08/04/2017   Procedure: POLYPECTOMY;  Surgeon: Lucilla Lame, MD;  Location: [place];  Service: Gastroenterology;  Laterality: N/A;   RHINOPLASTY      OB History   No obstetric history on file.      Home Medications    Prior to Admission medications   Medication Sig Start Date End Date Taking? Authorizing Provider  fluconazole (DIFLUCAN) 150 MG tablet Take 1 tablet (150 mg total) by mouth daily. Take one in 3 days, then when finished with the antibiotic 10/01/21  Yes Rodriguez-Southworth, Sunday Spillers, PA-C  levothyroxine (SYNTHROID, LEVOTHROID) 25 MCG tablet Take 25 mcg by mouth daily before breakfast.   Yes [provider]  sulfamethoxazole-trimethoprim (BACTRIM DS) 800-160 MG tablet Take 1 tablet by mouth 2 (two) times daily for 7 days. 10/01/21 10/08/21 Yes Rodriguez-Southworth, Sunday Spillers, PA-C    Family History Family History  Problem Relation Age of Onset   Breast cancer Neg Hx     Social History Social History    Tobacco Use   Smoking status: Former   Smokeless tobacco: Never   Tobacco comments:    only smokes when stressed  Vaping Use   Vaping Use: Never used  Substance Use Topics   Alcohol use: Yes    Comment: occasional   Drug use: No     Allergies   Cefuroxime axetil, Amoxicillin, Amoxicillin-pot clavulanate, Ceftin  [cefuroxime], and Doxycycline   Review of Systems Review of Systems + painful buttocks mass which is drianing a little blood. No fever or aches.   Physical Exam Triage Vital Signs ED Triage Vitals  Enc Vitals Group     BP 10/01/21 0834 (!) 160/89     Pulse Rate 10/01/21 0834 77     Resp 10/01/21 0834 18     Temp 10/01/21 0834 98.7 F (37.1 C)     Temp Source 10/01/21 0834 Oral     SpO2 10/01/21 0834 99 %     Weight 10/01/21 0831 149 lb 14.6 oz (68 kg)     Height 10/01/21 0831 5' (1.524 m)     Head Circumference --      Peak Flow --      Pain Score 10/01/21 0830 8     Pain Loc --      Pain Edu? --      Excl. in [place]? --    No data found.  Updated Vital  Signs BP (!) 160/89 (BP Location: Left Arm)   Pulse 77   Temp 98.7 F (37.1 C) (Oral)   Resp 18   Ht 5' (1.524 m)   Wt 149 lb 14.6 oz (68 kg)   SpO2 99%   BMI 29.28 kg/m   Visual Acuity Right Eye Distance:   Left Eye Distance:   Bilateral Distance:    Right Eye Near:   Left Eye Near:    Bilateral Near:     Physical Exam Vitals and nursing note reviewed.  Constitutional:      General: She is not in acute distress.    Appearance: She is not toxic-appearing.  HENT:     Head: Normocephalic.     Right Ear: External ear normal.     Left Ear: External ear normal.  Eyes:     General: No scleral icterus.    Conjunctiva/sclera: Conjunctivae normal.  Pulmonary:     Effort: Pulmonary effort is normal.  Musculoskeletal:        General: Normal range of motion.     Cervical back: Neck supple.  Skin:    General: Skin is warm and dry.     Findings: No rash.     Comments: Has L inner buttocks  abscess which is fluctuant and very tender.   Neurological:     Mental Status: She is alert and oriented to person, place, and time.     Gait: Gait normal.  Psychiatric:        Mood and Affect: Mood normal.        Behavior: Behavior normal.        Thought Content: Thought content normal.        Judgment: Judgment normal.     UC Treatments / Results  Labs (all labs ordered are listed, but only abnormal results are displayed) Labs Reviewed  AEROBIC CULTURE W GRAM STAIN (SUPERFICIAL SPECIMEN)    EKG   Radiology No results found.  Procedures Incision and Drainage  Date/Time: 10/01/2021 1:37 PM Performed by: Shelby Mattocks, PA-C Authorized by: Shelby Mattocks, PA-C   Consent:    Consent obtained:  Verbal   Consent given by:  Patient   Risks discussed:  Pain and infection   Alternatives discussed:  No treatment Universal protocol:    Procedure explained and questions answered to patient or proxy's satisfaction: yes     Patient identity confirmed:  Verbally with patient Location:    Type:  Abscess   Location: L buttocks. Pre-procedure details:    Skin preparation:  Chlorhexidine Sedation:    Sedation type:  None Anesthesia:    Anesthesia method:  Local infiltration   Local anesthetic:  Lidocaine 1% WITH epi Procedure type:    Complexity:  Complex Procedure details:    Incision types:  Single straight   Incision depth:  Subcutaneous   Wound management:  Probed and deloculated and irrigated with saline   Drainage:  Purulent   Drainage amount:  Moderate   Packing materials:  1/4 in gauze Post-procedure details:    Procedure completion:  Tolerated well, no immediate complications Comments:     Bandage applied.  (including critical care time)  Medications Ordered in UC Medications - No data to display  Initial Impression / Assessment and Plan / UC Course  I have reviewed the triage vital signs and the nursing notes. L buttocks  abscess. Wound culture sent out. I started her on Bactrim DS as noted. Wound instructions reviewed.  Final Clinical Impressions(s) / UC  Diagnoses   Final diagnoses:  Cutaneous abscess, unspecified site     Discharge Instructions      Come back for pack changes if it falls out in 24 hours and you still have moderate puss draining  Use dry heat on area for 20 minutes 3-4 times a day today and tomorrow      ED Prescriptions     Medication Sig Dispense Auth. Provider   sulfamethoxazole-trimethoprim (BACTRIM DS) 800-160 MG tablet Take 1 tablet by mouth 2 (two) times daily for 7 days. 14 tablet Rodriguez-Southworth, Rolinda Impson, PA-C   fluconazole (DIFLUCAN) 150 MG tablet Take 1 tablet (150 mg total) by mouth daily. Take one in 3 days, then when finished with the antibiotic 2 tablet Rodriguez-Southworth, Sunday Spillers, PA-C      PDMP not reviewed this encounter.   Shelby Mattocks, PA-C 10/01/21 1340

## 2021-10-02 ENCOUNTER — Encounter: Payer: Self-pay | Admitting: Emergency Medicine

## 2021-10-02 ENCOUNTER — Ambulatory Visit
Admission: EM | Admit: 2021-10-02 | Discharge: 2021-10-02 | Disposition: A | Discharge status: Home / Self Care | Payer: Medicare Other

## 2021-10-02 ENCOUNTER — Other Ambulatory Visit: Payer: Self-pay

## 2021-10-02 DIAGNOSIS — L0231 Cutaneous abscess of buttock: Secondary | ICD-10-CM

## 2021-10-02 DIAGNOSIS — Z5189 Encounter for other specified aftercare: Secondary | ICD-10-CM

## 2021-10-02 NOTE — ED Triage Notes (Signed)
Pt presents today requesting a wound check. She was seen here yesterday and was told to return if packing came out of wound in rectal area.

## 2021-10-02 NOTE — ED Provider Notes (Signed)
[facility]    CSN: [id number] Arrival date & time: 10/02/21  1113      History   Chief Complaint Chief Complaint  Patient presents with   Wound Check    HPI Julie Sparks is a 65 y.o. female returning for wound check.  Patient was seen in urgent care yesterday for abscess of the buttock/perineum.  Patient had the area I&D.  Patient says she has had improvement in her pain.  Has been taking Bactrim DS as prescribed.  Patient says the area has drained a little but unfortunately the packing that was placed has fallen out.  She was instructed to return if the packing fell out.  She denies any fevers or worsening symptoms.  No other complaints.  No new complaints.  HPI  Past Medical History:  Diagnosis Date   Heart murmur    Functional   Hypothyroidism    Vertigo    positional    Patient Active Problem List   Diagnosis Date Noted   Special screening for malignant neoplasms, colon    Benign neoplasm of ascending colon     Past Surgical History:  Procedure Laterality Date   BLEPHAROPLASTY Bilateral    CERVICAL DISCECTOMY     COLONOSCOPY WITH PROPOFOL N/A 08/04/2017   Procedure: COLONOSCOPY WITH PROPOFOL;  Surgeon: Lucilla Lame, MD;  Location: [place];  Service: Gastroenterology;  Laterality: N/A;   DIAGNOSTIC LAPAROSCOPY     POLYPECTOMY N/A 08/04/2017   Procedure: POLYPECTOMY;  Surgeon: Lucilla Lame, MD;  Location: [place];  Service: Gastroenterology;  Laterality: N/A;   RHINOPLASTY      OB History   No obstetric history on file.      Home Medications    Prior to Admission medications   Medication Sig Start Date End Date Taking? Authorizing Provider  fluconazole (DIFLUCAN) 150 MG tablet Take 1 tablet (150 mg total) by mouth daily. Take one in 3 days, then when finished with the antibiotic 10/01/21   Rodriguez-Southworth, Sunday Spillers, PA-C  levothyroxine (SYNTHROID, LEVOTHROID) 25 MCG tablet Take 25 mcg by mouth daily before  breakfast.    [provider]  sulfamethoxazole-trimethoprim (BACTRIM DS) 800-160 MG tablet Take 1 tablet by mouth 2 (two) times daily for 7 days. 10/01/21 10/08/21  Rodriguez-Southworth, Sunday Spillers, PA-C    Family History Family History  Problem Relation Age of Onset   Breast cancer Neg Hx     Social History Social History   Tobacco Use   Smoking status: Former   Smokeless tobacco: Never   Tobacco comments:    only smokes when stressed  Vaping Use   Vaping Use: Never used  Substance Use Topics   Alcohol use: Yes    Comment: occasional   Drug use: No     Allergies   Cefuroxime axetil, Amoxicillin, Amoxicillin-pot clavulanate, Ceftin  [cefuroxime], and Doxycycline   Review of Systems Review of Systems  Constitutional:  Negative for fatigue and fever.  Musculoskeletal:  Negative for back pain.  Skin:  Positive for color change and wound.  Neurological:  Negative for weakness.    Physical Exam Triage Vital Signs ED Triage Vitals  Enc Vitals Group     BP 10/02/21 1241 138/79     Pulse Rate 10/02/21 1244 64     Resp 10/02/21 1241 18     Temp 10/02/21 1241 98.3 F (36.8 C)     Temp Source 10/02/21 1241 Oral     SpO2 10/02/21 1241 98 %  Weight --      Height --      Head Circumference --      Peak Flow --      Pain Score 10/02/21 1238 3     Pain Loc --      Pain Edu? --      Excl. in [place]? --    No data found.  Updated Vital Signs BP 138/79 (BP Location: Left Arm)   Pulse 64   Temp 98.3 F (36.8 C) (Oral)   Resp 18   SpO2 98%      Physical Exam Vitals and nursing note reviewed.  Constitutional:      General: She is not in acute distress.    Appearance: Normal appearance. She is not ill-appearing or toxic-appearing.  HENT:     Head: Normocephalic and atraumatic.  Eyes:     General: No scleral icterus.       Right eye: No discharge.        Left eye: No discharge.     Conjunctiva/sclera: Conjunctivae normal.  Cardiovascular:     Rate and  Rhythm: Normal rate and regular rhythm.  Pulmonary:     Effort: Pulmonary effort is normal. No respiratory distress.  Musculoskeletal:     Cervical back: Neck supple.  Skin:    General: Skin is dry.     Comments: 2 cm x 2 cm area of induration and erythema w/o fluctuance and with central wound, slight serosanguinous drainage left buttocks  Neurological:     General: No focal deficit present.     Mental Status: She is alert. Mental status is at baseline.     Motor: No weakness.     Gait: Gait normal.  Psychiatric:        Mood and Affect: Mood normal.        Behavior: Behavior normal.        Thought Content: Thought content normal.     UC Treatments / Results  Labs (all labs ordered are listed, but only abnormal results are displayed) Labs Reviewed - No data to display  EKG   Radiology No results found.  Procedures Procedures (including critical care time)  Medications Ordered in UC Medications - No data to display  Initial Impression / Assessment and Plan / UC Course  I have reviewed the triage vital signs and the nursing notes.  Pertinent labs & imaging results that were available during my care of the patient were reviewed by me and considered in my medical decision making (see chart for details).  65 year old female returning for wound check.  Patient seen yesterday here for incision and drainage of abscess of left buttocks.  Patient on Bactrim DS.  Culture sent and pending results.  Packing fell out.  Today on 8 cleansed the area and since the wound was shallow I did probe the wound to try to express any pustular material but unsuccessful.  Wound repacked with iodoform gauze and covered with gauze.  Patient instructed to return in 2 to 3 days to have the area looked at again and possibly repacked.  Advised to continue with the Bactrim DS.  Advised patient to follow-up sooner for any worsening symptoms.  ED precautions given.  Final Clinical Impressions(s) / UC Diagnoses    Final diagnoses:  Wound check, abscess     Discharge Instructions      -Re-check in 48-72 hours here to have it possibly re-packed -Continued antibiotics -F/u sooner if pain worsens, fever, etc  ED Prescriptions   None    PDMP not reviewed this encounter.   Danton Clap, PA-C 10/02/21 1441

## 2021-10-02 NOTE — Discharge Instructions (Addendum)
-  Re-check in 48-72 hours here to have it possibly re-packed -Continued antibiotics -F/u sooner if pain worsens, fever, etc

## 2021-10-06 LAB — AEROBIC CULTURE W GRAM STAIN (SUPERFICIAL SPECIMEN)
Gram Stain: NONE SEEN
Special Requests: NORMAL

## 2021-10-11 ENCOUNTER — Telehealth: Payer: Self-pay | Admitting: Emergency Medicine

## 2021-10-11 MED ORDER — CLARITHROMYCIN 500 MG PO TABS: 500.0000 mg | ORAL_TABLET | Freq: Two times a day (BID) | ORAL | 0 refills | Status: AC

## 2022-07-30 ENCOUNTER — Other Ambulatory Visit: Payer: Self-pay | Admitting: Family Medicine

## 2022-07-30 DIAGNOSIS — Z1231 Encounter for screening mammogram for malignant neoplasm of breast: Secondary | ICD-10-CM

## 2022-08-14 ENCOUNTER — Ambulatory Visit
Admission: RE | Admit: 2022-08-14 | Discharge: 2022-08-14 | Disposition: A | Discharge status: Home / Self Care | Payer: Medicare PPO | Source: Ambulatory Visit | Attending: Family Medicine | Admitting: Family Medicine

## 2022-08-14 DIAGNOSIS — Z1231 Encounter for screening mammogram for malignant neoplasm of breast: Secondary | ICD-10-CM | POA: Insufficient documentation

## 2023-03-06 ENCOUNTER — Ambulatory Visit
Admission: RE | Admit: 2023-03-06 | Discharge: 2023-03-06 | Disposition: A | Discharge status: Home / Self Care | Payer: Medicare PPO | Source: Ambulatory Visit | Attending: Family Medicine | Admitting: Family Medicine

## 2023-03-06 ENCOUNTER — Ambulatory Visit (INDEPENDENT_AMBULATORY_CARE_PROVIDER_SITE_OTHER): Payer: Medicare PPO

## 2023-03-06 VITALS — BP 145/77 | HR 65 | Temp 98.5°F | Ht 60.0 in | Wt 155.0 lb

## 2023-03-06 DIAGNOSIS — M79672 Pain in left foot: Secondary | ICD-10-CM

## 2023-03-06 DIAGNOSIS — S90852A Superficial foreign body, left foot, initial encounter: Secondary | ICD-10-CM

## 2023-03-06 MED ORDER — FLUCONAZOLE 150 MG PO TABS: 150.0000 mg | ORAL_TABLET | ORAL | 0 refills | Status: AC

## 2023-03-06 MED ORDER — SULFAMETHOXAZOLE-TRIMETHOPRIM 800-160 MG PO TABS: 1.0000 | ORAL_TABLET | Freq: Two times a day (BID) | ORAL | 0 refills | Status: AC

## 2023-03-06 NOTE — ED Triage Notes (Addendum)
Pt states she was doing yard work and got a thorn stuck on bottom on LT foot DOI: 03/04/23. Pt states her and her boyfriend tried to remove it & picked at area.   Date of last tetanus per chart: 01/23/2013

## 2023-03-06 NOTE — ED Provider Notes (Addendum)
[facility]    CSN: [id number] Arrival date & time: 03/06/23  1151      History   Chief Complaint Chief Complaint  Patient presents with   Foot Pain    LT foot     HPI  HPI Julie Sparks is a 67 y.o. female.   [place] presents for left foot pain after doing yard work.  States she had a thorn stuck in the bottom of her foot and her boyfriend tried to remove it and picked at the area to try to remove it.  She continues to have pain with walking.     Past Medical History:  Diagnosis Date   Heart murmur    Functional   Hypothyroidism    Vertigo    positional    Patient Active Problem List   Diagnosis Date Noted   Special screening for malignant neoplasms, colon    Benign neoplasm of ascending colon     Past Surgical History:  Procedure Laterality Date   BLEPHAROPLASTY Bilateral    CERVICAL DISCECTOMY     COLONOSCOPY WITH PROPOFOL N/A 08/04/2017   Procedure: COLONOSCOPY WITH PROPOFOL;  Surgeon: Lucilla Lame, MD;  Location: [place];  Service: Gastroenterology;  Laterality: N/A;   DIAGNOSTIC LAPAROSCOPY     POLYPECTOMY N/A 08/04/2017   Procedure: POLYPECTOMY;  Surgeon: Lucilla Lame, MD;  Location: [place];  Service: Gastroenterology;  Laterality: N/A;   RHINOPLASTY      OB History   No obstetric history on file.      Home Medications    Prior to Admission medications   Medication Sig Start Date End Date Taking? Authorizing Provider  fluconazole (DIFLUCAN) 150 MG tablet Take 1 tablet (150 mg total) by mouth every 3 (three) days for 2 doses. 03/10/23 03/14/23 Yes Kieren Ricci, DO  levothyroxine (SYNTHROID, LEVOTHROID) 25 MCG tablet Take 25 mcg by mouth daily before breakfast.   Yes [provider]  sulfamethoxazole-trimethoprim (BACTRIM DS) 800-160 MG tablet Take 1 tablet by mouth 2 (two) times daily for 7 days. 03/06/23 03/13/23 Yes Niles Ess, Ronnette Juniper, DO    Family History Family History  Problem Relation Age  of Onset   Breast cancer Neg Hx     Social History Social History   Tobacco Use   Smoking status: Former   Smokeless tobacco: Never   Tobacco comments:    only smokes when stressed  Vaping Use   Vaping Use: Never used  Substance Use Topics   Alcohol use: Yes    Comment: occasional   Drug use: No     Allergies   Cefuroxime axetil, Amoxicillin-pot clavulanate, Doxycycline, Amoxicillin, and Cefuroxime   Review of Systems Review of Systems: :negative unless otherwise stated in HPI.      Physical Exam Triage Vital Signs ED Triage Vitals  Enc Vitals Group     BP 03/06/23 1202 (!) 145/77     Pulse Rate 03/06/23 1202 65     Resp --      Temp 03/06/23 1202 98.5 F (36.9 C)     Temp Source 03/06/23 1202 Oral     SpO2 03/06/23 1202 97 %     Weight 03/06/23 1201 155 lb (70.3 kg)     Height 03/06/23 1201 5' (1.524 m)     Head Circumference --      Peak Flow --      Pain Score 03/06/23 1201 2     Pain Loc --      Pain Edu? --  Excl. in GC? --    No data found.  Updated Vital Signs BP (!) 145/77 (BP Location: Right Arm)   Pulse 65   Temp 98.5 F (36.9 C) (Oral)   Ht 5' (1.524 m)   Wt 70.3 kg   SpO2 97%   BMI 30.27 kg/m   Visual Acuity Right Eye Distance:   Left Eye Distance:   Bilateral Distance:    Right Eye Near:   Left Eye Near:    Bilateral Near:     Physical Exam GEN: well appearing female in no acute distress  CVS: well perfused  RESP: speaking in full sentences without pause, no respiratory distress  MSK: Left heel: punctate area with hypertrophic scar on the heel of left foot, area is tender to palpation, normal ROM, normal strength, strong DP pulse     UC Treatments / Results  Labs (all labs ordered are listed, but only abnormal results are displayed) Labs Reviewed - No data to display  EKG   Radiology DG Foot Complete Left  Result Date: 03/06/2023 CLINICAL DATA:  Patient was doing yard work and got a thorn stuck in bottom of  left foot at mid plantar surface near instep of heel. Date of injury 03/04/2023. Tried to remove it at home. Question of foreign body in heel. EXAM: LEFT FOOT - COMPLETE 3+ VIEW COMPARISON:  None Available. FINDINGS: Normal bone mineralization. Joint spaces are preserved. Minimal chronic enthesopathic change at the Achilles insertion on the calcaneus. There are multiple punctate densities overlying the posterior ankle soft tissues on lateral view that also extend outside of the patient's skin surface and appear to be artifactual. On lateral view there is mild low-density likely edema within the plantar midfoot, plantar to the anterior process of the calcaneus. No dense foreign body is seen in this region, however there is a 2 mm focus of soft tissue density that is indeterminate for a low-density foreign body in the area of interest, approximately 1.6 cm plantar to the anterior process of the calcaneus and seen only on lateral view. IMPRESSION: Edema within the plantar midfoot area of interest. Within this region, there is a 2 mm focus that is the same density as surrounding musculature and may just represent the patient's normal anatomy. It is difficult to exclude a low-density foreign body given the patient's history. Electronically Signed   By: Yvonne Kendall M.D.   On: 03/06/2023 12:51    Procedures Procedures (including critical care time)  Medications Ordered in UC Medications - No data to display  Initial Impression / Assessment and Plan / UC Course  I have reviewed the triage vital signs and the nursing notes.  Pertinent labs & imaging results that were available during my care of the patient were reviewed by me and considered in my medical decision making (see chart for details).      Pt is a 67 y.o.  female with questionable foreign body in her left heel with associated heel pain. Obtained plain films as pt states she is unsure if object was wood, glass or metal. Believes it was a torn that  went through her yard shoes. Personally reviewed by me were unremarkable for fracture or dislocation. Radiologist notes possible 2 mm focus in the plantar midfoot. Unable to effectively search and retrieve such a a small foreign body here if even present. Given antibiotics for prophylaxis. Diflucan for antibiotic associated yeast infection. Referral to podiatry placed. Pt to call and schedule appt if needed.   Return  and ED precautions given. Understanding voiced. Discussed MDM, treatment plan and plan for follow-up with patient/parent who agrees with plan.   Final Clinical Impressions(s) / UC Diagnoses   Final diagnoses:  Foot pain, left  Foreign body in left foot, initial encounter     Discharge Instructions      Per the radiologist, your x-ray showed possible foreign body  in your foot.  Stop by the pharmacy to pick up your prescriptions.  An urgent referral was placed for a podiatrist. Follow up with podiatry as discussed. You can also schedule an appointment with Dr. Alvera Singh office.       ED Prescriptions     Medication Sig Dispense Auth. Provider   fluconazole (DIFLUCAN) 150 MG tablet Take 1 tablet (150 mg total) by mouth every 3 (three) days for 2 doses. 2 tablet Kalani Baray, DO   sulfamethoxazole-trimethoprim (BACTRIM DS) 800-160 MG tablet Take 1 tablet by mouth 2 (two) times daily for 7 days. 14 tablet Naol Ontiveros, Ronnette Juniper, DO      PDMP not reviewed this encounter.      Lyndee Hensen, DO 03/06/23 1320

## 2023-03-06 NOTE — Discharge Instructions (Addendum)
Per the radiologist, your x-ray showed possible foreign body  in your foot.  Stop by the pharmacy to pick up your prescriptions.  An urgent referral was placed for a podiatrist. Follow up with podiatry as discussed. You can also schedule an appointment with Dr. Alvera Singh office.

## 2023-10-09 ENCOUNTER — Other Ambulatory Visit: Payer: Self-pay | Admitting: Family Medicine

## 2023-10-09 DIAGNOSIS — Z1231 Encounter for screening mammogram for malignant neoplasm of breast: Secondary | ICD-10-CM

## 2023-10-15 ENCOUNTER — Ambulatory Visit
Admission: RE | Admit: 2023-10-15 | Discharge: 2023-10-15 | Disposition: A | Discharge status: Home / Self Care | Payer: Medicare PPO | Source: Ambulatory Visit | Attending: Family Medicine | Admitting: Family Medicine

## 2023-10-15 DIAGNOSIS — Z1231 Encounter for screening mammogram for malignant neoplasm of breast: Secondary | ICD-10-CM | POA: Insufficient documentation

## 2024-11-24 ENCOUNTER — Other Ambulatory Visit: Payer: Self-pay | Admitting: Family Medicine

## 2024-11-24 ENCOUNTER — Ambulatory Visit
Admission: RE | Admit: 2024-11-24 | Discharge: 2024-11-24 | Disposition: A | Source: Ambulatory Visit | Attending: Family Medicine | Admitting: Family Medicine

## 2024-11-24 DIAGNOSIS — Z1231 Encounter for screening mammogram for malignant neoplasm of breast: Secondary | ICD-10-CM
